# Patient Record
Sex: Male | Born: 1980 | Race: Black or African American | Hispanic: No | Marital: Married | Smoking: Current every day smoker
Health system: Southern US, Community
[De-identification: ages and names within clinical notes are randomized; demographics above are authoritative.]

## PROBLEM LIST (undated history)

## (undated) DIAGNOSIS — J45909 Unspecified asthma, uncomplicated: Secondary | ICD-10-CM

## (undated) HISTORY — PX: KNEE SURGERY: SHX244

---

## 2009-11-05 ENCOUNTER — Emergency Department: Payer: Self-pay | Admitting: Emergency Medicine

## 2010-11-10 ENCOUNTER — Observation Stay: Payer: Self-pay | Admitting: Internal Medicine

## 2011-08-19 ENCOUNTER — Emergency Department: Payer: Self-pay | Admitting: Emergency Medicine

## 2012-11-17 IMAGING — CR DG CHEST 1V PORT
1 series · 1 of 1 positions shown · non-contrast
Comparison: none

REASON FOR EXAM: cp, syncopy
COMMENTS:

PROCEDURE:     DXR - DXR PORTABLE CHEST SINGLE VIEW  - November 10, 2010 [DATE]
RESULT:     The lung fields are clear. Heart, mediastinal and osseous
structures show no significant abnormalities.

[view not recorded]
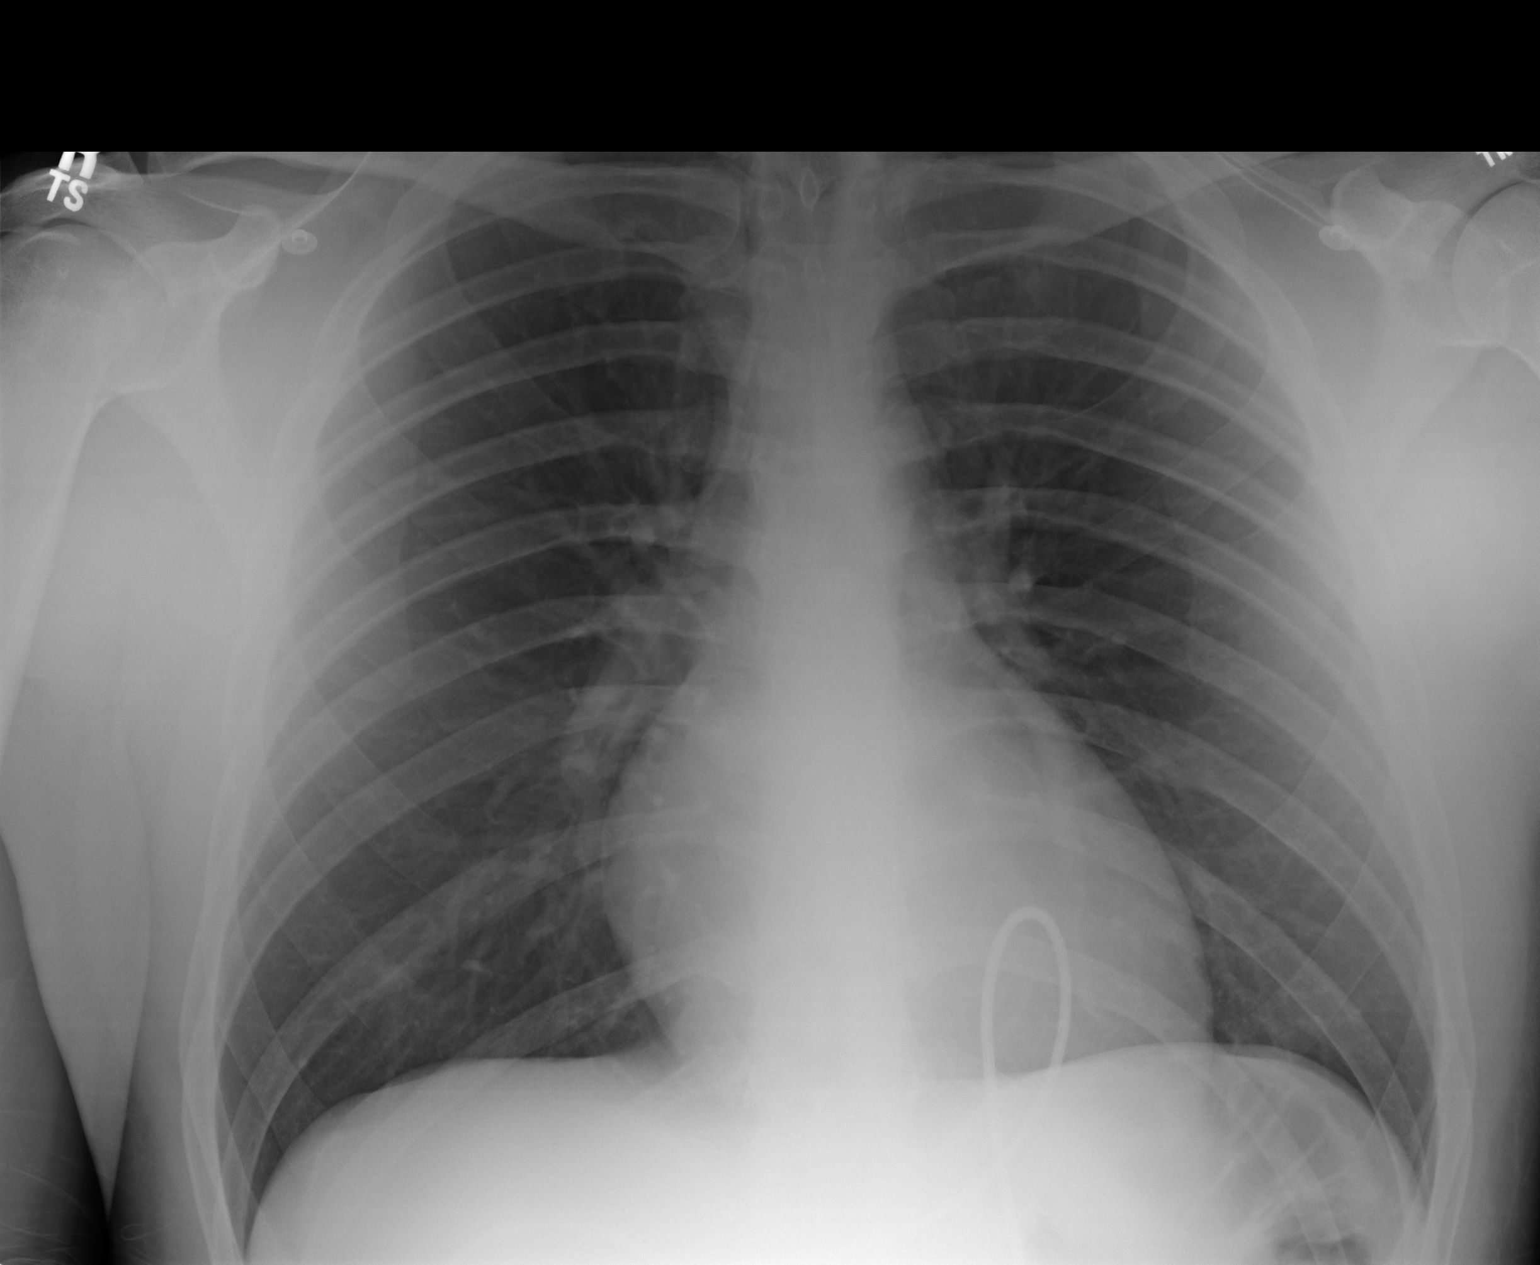

[1 of 1 positions shown; findings below may reference images not displayed]

IMPRESSION: 1. No acute changes are identified.

## 2012-11-17 IMAGING — CT CT HEAD WITHOUT CONTRAST
3 of 4 series · 17 of 30 positions shown, 19 images · non-contrast
Comparison: none

REASON FOR EXAM: syncope
COMMENTS:

[Series 2: bone · axial · 0.43mm/px · z∈[+900,+996]mm · 6 of 31 slices shown]
[im 4/31  bone]
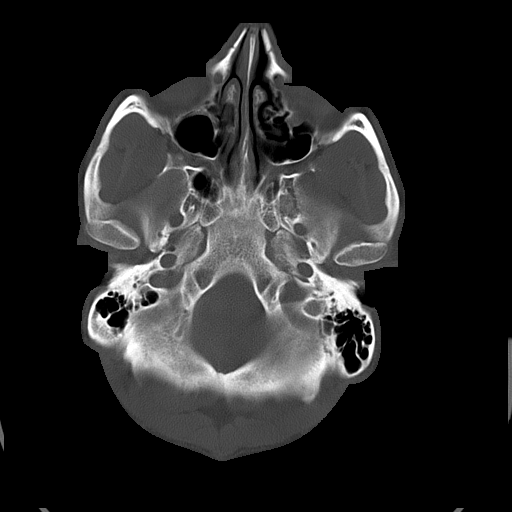
[im 8/31  bone]
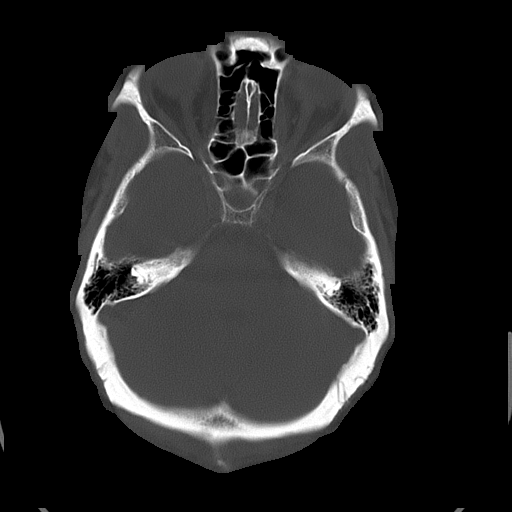
[im 12/31  bone]
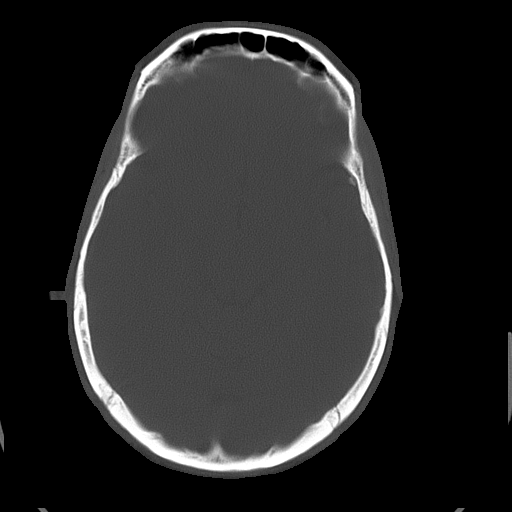
[im 16/31  bone]
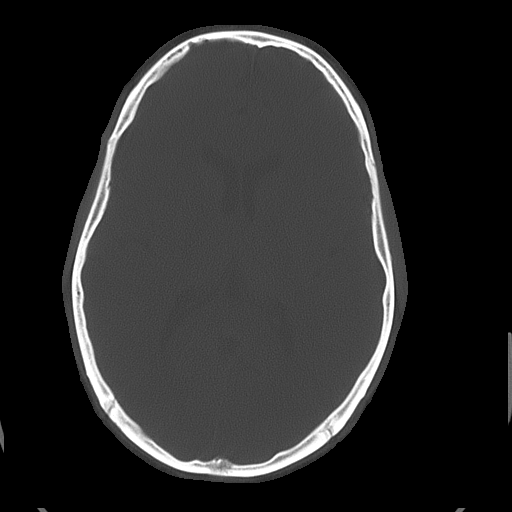
[im 19/31  bone]
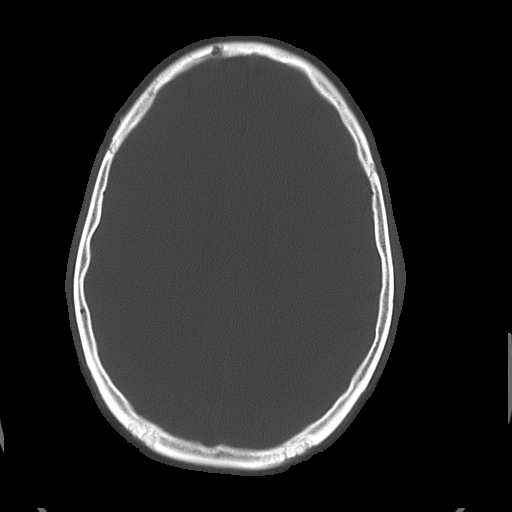
[im 23/31  bone]
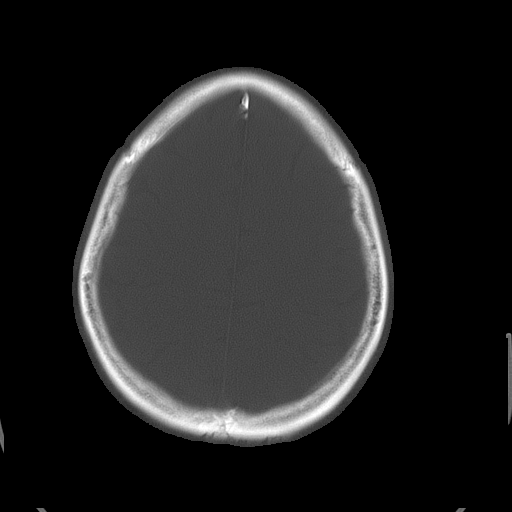

[Series 3: without · axial · non-contrast · 0.43mm/px · z∈[+900,+1016]mm · 7 of 31 slices shown, 9 images (1 of 2)]
[im 4/31  brain]
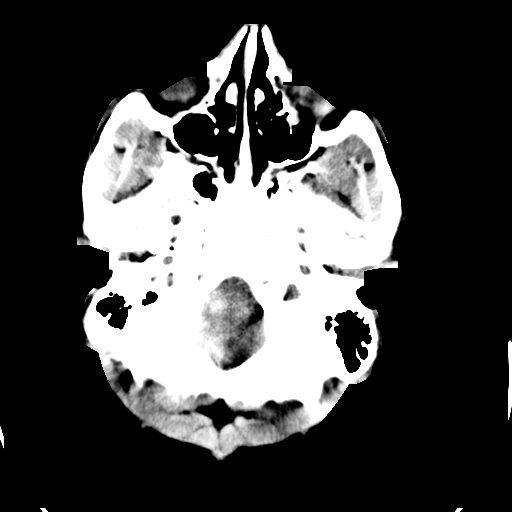
[im 4/31  bone]
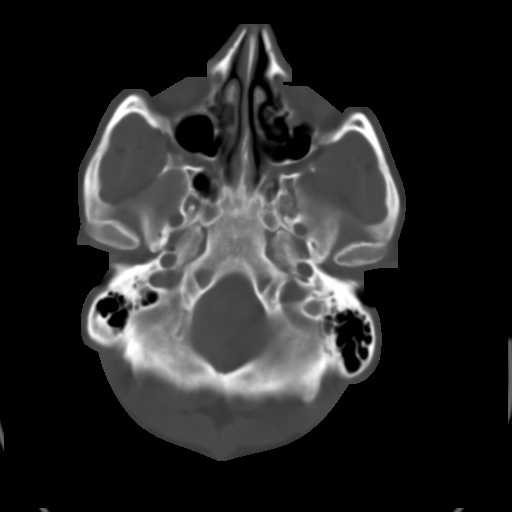
[im 8/31  brain]
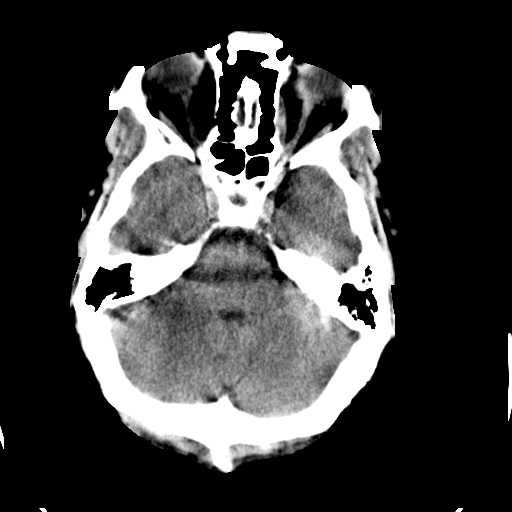
[im 12/31  brain]
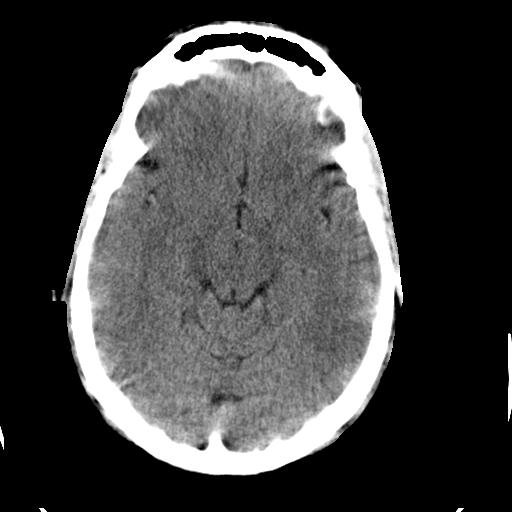
[im 16/31  brain]
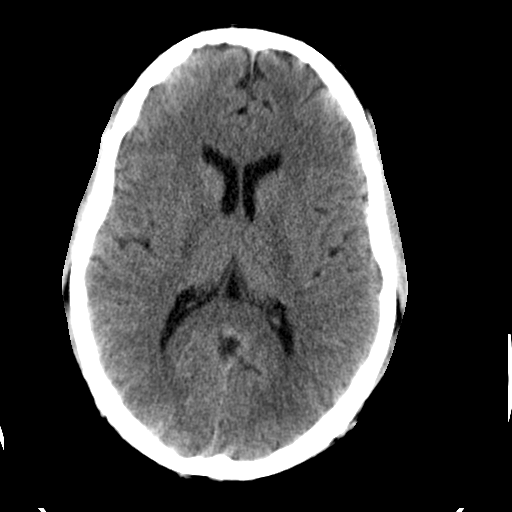
[im 19/31  brain]
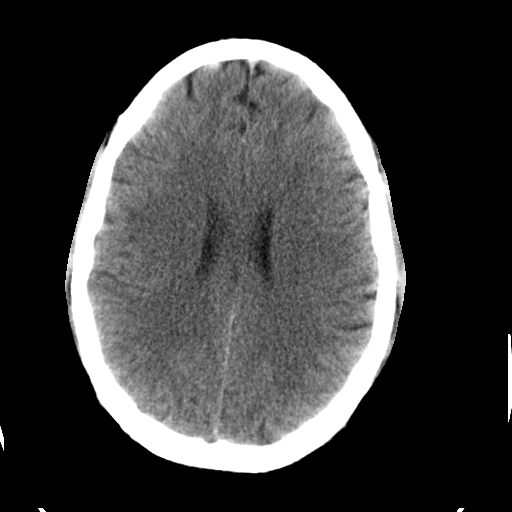
[im 19/31  bone]
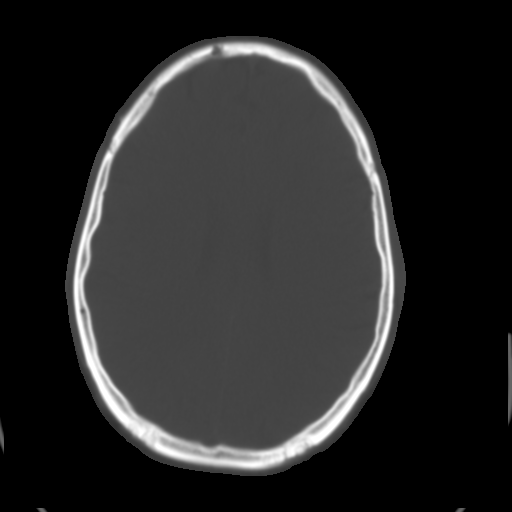
[im 23/31  brain]
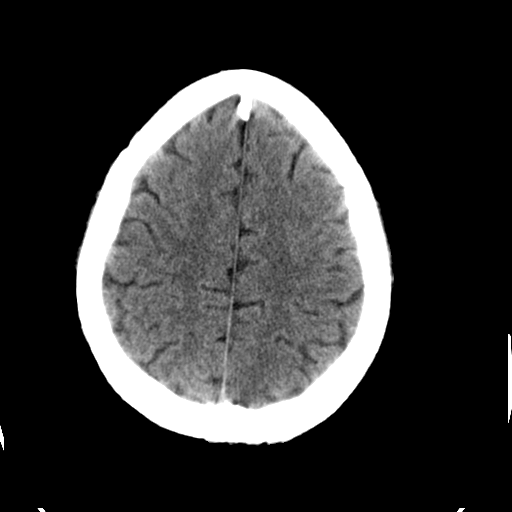
[im 27/31  brain]
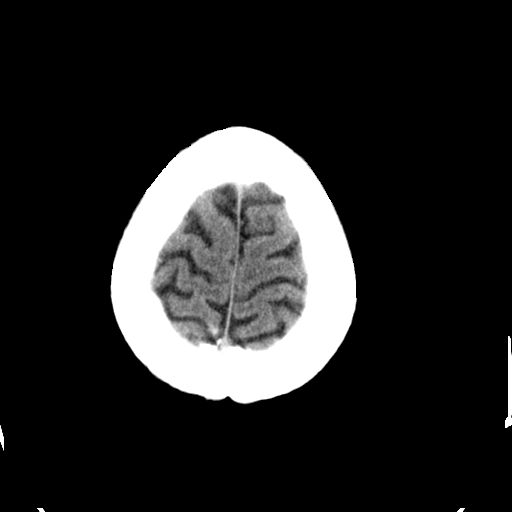

[Series 4: without · axial · non-contrast · 0.43mm/px · z∈[+906,+966]mm · 4 of 22 slices shown (2 of 2)]
[im 5/22  brain]
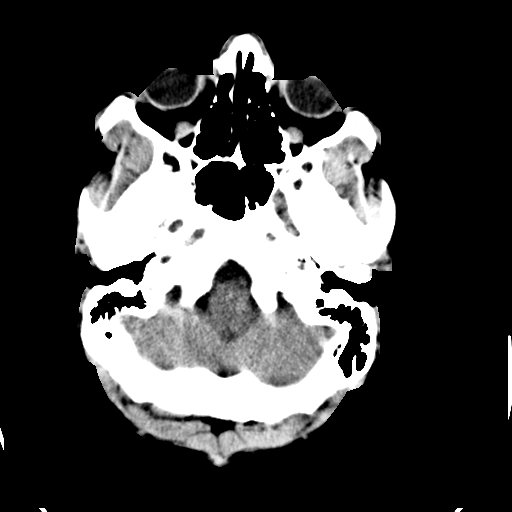
[im 9/22  brain]
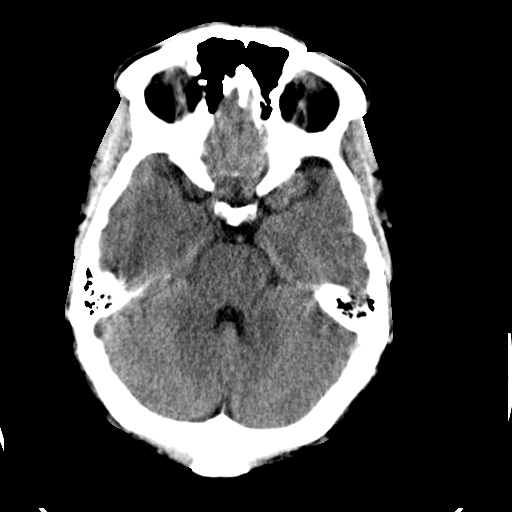
[im 13/22  brain]
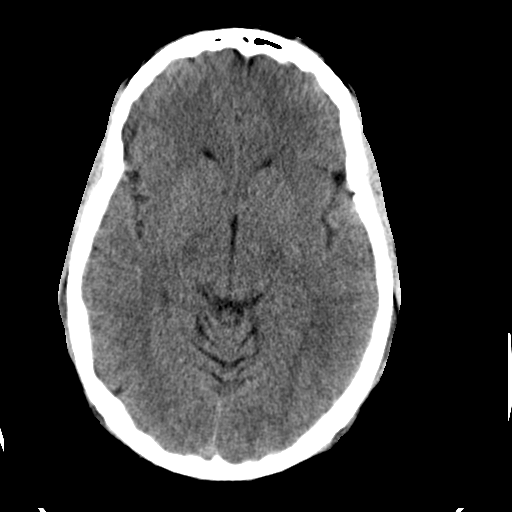
[im 17/22  brain]
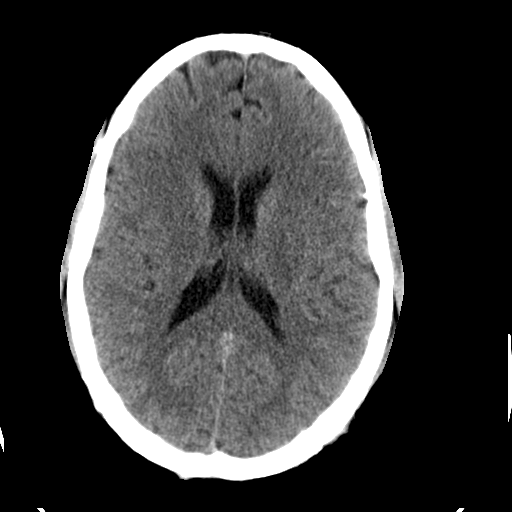

[17 of 30 positions shown; findings below may reference images not displayed]

PROCEDURE:     CT  - CT HEAD WITHOUT CONTRAST  - November 10, 2010  [DATE]

RESULT:     Axial noncontrast CT scanning was performed through the brain at
5 mm intervals and slice thicknesses. Images were repeated due to motion.

The ventricles are normal in size and position. There is no intracranial
hemorrhage nor intracranial mass effect. There is no evidence of an evolving
ischemic infarction. The cerebellum and brainstem are normal in density. At
bone window settings I see no air-fluid levels in the paranasal sinuses.
There is minimal mucoperiosteal thickening in the left maxillary sinus.
There is no evidence of an acute skull fracture. No lytic or blastic bony
lesion is seen.
IMPRESSION: I see no acute intracranial abnormality.

## 2013-09-17 ENCOUNTER — Encounter: Payer: Self-pay | Admitting: Surgery

## 2013-09-27 ENCOUNTER — Encounter: Payer: Self-pay | Admitting: Surgery

## 2013-10-28 ENCOUNTER — Encounter: Payer: Self-pay | Admitting: Surgery

## 2013-11-25 ENCOUNTER — Encounter: Payer: Self-pay | Admitting: Surgery

## 2015-09-07 ENCOUNTER — Ambulatory Visit (INDEPENDENT_AMBULATORY_CARE_PROVIDER_SITE_OTHER): Payer: Worker's Compensation

## 2015-09-07 ENCOUNTER — Encounter: Payer: Self-pay | Admitting: Gynecology

## 2015-09-07 ENCOUNTER — Ambulatory Visit
Admission: EM | Admit: 2015-09-07 | Discharge: 2015-09-07 | Disposition: A | Discharge status: Home / Self Care | Payer: Worker's Compensation | Attending: Family Medicine | Admitting: Family Medicine

## 2015-09-07 DIAGNOSIS — X31XXXA Exposure to excessive natural cold, initial encounter: Secondary | ICD-10-CM

## 2015-09-07 DIAGNOSIS — T33521A Superficial frostbite of right hand, initial encounter: Secondary | ICD-10-CM

## 2015-09-07 HISTORY — DX: Unspecified asthma, uncomplicated: J45.909

## 2015-09-07 MED ORDER — MUPIROCIN 2 % EX OINT: 1.0000 "application " | TOPICAL_OINTMENT | Freq: Three times a day (TID) | CUTANEOUS | Status: AC

## 2015-09-07 NOTE — Discharge Instructions (Signed)
Frostbite Frostbite happens when the skin and deeper tissues freeze. Frostbite can happen when the skin is not covered well in cold weather. Frostbite often affects the feet, hands, fingers, nose, and ears. You should get out of the cold right away if you have pain, redness, or loss of feeling (numbness) in part of your body. Frostbite can cause lasting damage.  HOME CARE  Follow instructions from your doctor about how to take care of bandages (dressings) that cover your blisters. It is important to:  Wash your hands with soap and water before you change your bandages. If you do not have soap and water, use hand sanitizer.  Change or remove your bandages as told by your doctor.  Rest your frozen (frostbitten) body parts. Raise (elevate) them above the level of your heart if possible.  Try to avoid moving your frozen body parts.  Take over-the-counter and prescription medicines only as told by your doctor.  Drink enough fluid to keep your pee (urine) clear or pale yellow.  Keep all follow-up visits as told by your doctor. This is important. GET HELP IF: You have a fever. GET HELP RIGHT AWAY IF:  You have more redness, swelling, or pain where your skin froze.  You have fluid, blood, or pus coming from where your skin froze.  You have very bad peeling skin where your skin froze.  Your frozen skin turns a dark color.  You see a red line on your skin and it spreads out from where your skin froze.   This information is not intended to replace advice given to you by your health care provider. Make sure you discuss any questions you have with your health care provider.   Document Released: 10/21/2004 Document Revised: 06/04/2015 Document Reviewed: 01/06/2015 Elsevier Interactive Patient Education [employer]2016 [employer]vier Inc.

## 2015-09-07 NOTE — ED Provider Notes (Signed)
CSN: [id number]     Arrival date & time 09/07/15  1024 History   First MD Initiated Contact with Patient 09/07/15 1205     Nurses notes were reviewed.   Chief Complaint  Patient presents with  . Finger Injury   He is here because of injuryto his fourth R  Finger. 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 Yesterday he noticed swelling of his forefinger and increased numbness.. This morning at his first break the swelling and numbness gotten worse and by lunchtime it progressively worsened. He denies any trauma at work that he does work in the freezer. No history of frostbite before or any trauma to the hand or finger. He states that about a month ago hefeeling of his thumb and the fourth finger and some increased numbness. 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  (Consider location/radiation/quality/duration/timing/severity/associated sxs/prior Treatment) Patient is a 34 y.o. male presenting with hand pain. The history is provided by the patient. No language interpreter was used.  Hand Pain This is a new problem. The current episode started yesterday. The problem occurs constantly. The problem has been gradually worsening. Pertinent negatives include no chest pain, no abdominal pain, no headaches and no shortness of breath. Nothing relieves the symptoms. He has tried nothing for the symptoms. The treatment provided no relief.    Past Medical History  Diagnosis Date  . Asthma    Past Surgical History  Procedure Laterality Date  . Knee surgery     No family history on file. Social History  Substance Use Topics  . Smoking status: Current Every Day Smoker -- 0.50 packs/day    Types: Cigarettes  . Smokeless tobacco: None  . Alcohol Use: Yes    Review of Systems  Respiratory: Negative for shortness of breath.     Cardiovascular: Negative for chest pain.  Gastrointestinal: Negative for abdominal pain.  Neurological: Negative for headaches.  All other systems reviewed and are negative.   Allergies  Review of patient's allergies indicates no known allergies.  Home Medications   Prior to Admission medications   Medication Sig Start Date End Date Taking? Authorizing Provider  ibuprofen (ADVIL,MOTRIN) 400 MG tablet Take 400 mg by mouth every 6 (six) hours as needed.   Yes Historical Provider, MD  mupirocin ointment (BACTROBAN) 2 % Apply 1 application topically 3 (three) times daily. 09/07/15   Hassan RowanEugene Labrea Eccleston, MD   Meds Ordered and Administered this Visit  Medications - No data to display  BP 119/75 mmHg  Pulse 73  Temp(Src) 97.5 F (36.4 C) (Oral)  Resp 18  Ht 6\' 2"  (1.88 m)  Wt 250 lb (113.399 kg)  BMI 32.08 kg/m2  SpO2 99% No data found.   Physical Exam  Constitutional: He is oriented to person, place, and time. He appears well-developed and well-nourished.  HENT:  Head: Normocephalic and atraumatic.  Eyes: Pupils are equal, round, and reactive to light.  Musculoskeletal: He exhibits edema. He exhibits no tenderness.       Right hand: He exhibits deformity and swelling. Normal sensation noted. Normal strength noted.       Hands:  numbness on the tip of the fourth finger. The fingers off she swollen no signs of broken skin or insect bites as noted. No drainage since coming from the finger but is  involving the finger to the nail bed and to the distal PIP joint  Neurological: He is alert and oriented to person, place, and time.  Skin: Skin is warm and dry.  Psychiatric: He has a normal mood and affect.  Vitals reviewed.    ED Course  Procedures (including critical care time)  Labs Review Labs Reviewed - No data to display  Imaging Review Dg Finger Ring Right  09/07/2015  CLINICAL DATA:  Right fourth finger numbness, no known injury, initial encounter EXAM: RIGHT RING FINGER 2+V  COMPARISON:  None. FINDINGS: There is no evidence of fracture or dislocation. There is no evidence of arthropathy or other focal bone abnormality. Soft tissues are unremarkable. IMPRESSION: No acute abnormality noted. Electronically Signed   By: Alcide Clever M.D.   On: 09/07/2015 13:18     Visual Acuity Review  Right Eye Distance:   Left Eye Distance:   Bilateral Distance:    Right Eye Near:   Left Eye Near:    Bilateral Near:         MDM   1. Frostbite of hand, right, initial encounter       We will x-ray the right hand the fourth ring finger make sure there is no foreign objects or sliver of metal  got into the finger.   X ray is negative for the finger. 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 We will limit his exposure to cold to no more than 54 at work. Will refer to grand Thelma Barge is Janace Litten for occupational medicine follow-up. Will try to have him start seeing between next Thursday and next Monday. We'll give him prescription for Bactrim ointment to apply to the finger 2-3 times a day until he sees nurse practitioner Christell Constant.        Hassan Rowan, MD 09/07/15 [phone]

## 2015-09-07 NOTE — ED Notes (Addendum)
[employer]. Injury. Per patient right ring finger swollen and bruised x 1 month. Per patient work in Designer, television/film setthe freezer section of store.

## 2021-10-13 ENCOUNTER — Emergency Department
Admission: EM | Admit: 2021-10-13 | Discharge: 2021-10-13 | Disposition: A | Discharge status: Home / Self Care | Payer: BC Managed Care – PPO | Attending: Emergency Medicine | Admitting: Emergency Medicine

## 2021-10-13 ENCOUNTER — Other Ambulatory Visit: Payer: Self-pay

## 2021-10-13 DIAGNOSIS — F32A Depression, unspecified: Secondary | ICD-10-CM

## 2021-10-13 DIAGNOSIS — R45851 Suicidal ideations: Secondary | ICD-10-CM | POA: Diagnosis not present

## 2021-10-13 DIAGNOSIS — F4325 Adjustment disorder with mixed disturbance of emotions and conduct: Secondary | ICD-10-CM

## 2021-10-13 DIAGNOSIS — F4320 Adjustment disorder, unspecified: Secondary | ICD-10-CM | POA: Insufficient documentation

## 2021-10-13 DIAGNOSIS — R451 Restlessness and agitation: Secondary | ICD-10-CM | POA: Insufficient documentation

## 2021-10-13 DIAGNOSIS — R4689 Other symptoms and signs involving appearance and behavior: Secondary | ICD-10-CM

## 2021-10-13 DIAGNOSIS — Z046 Encounter for general psychiatric examination, requested by authority: Secondary | ICD-10-CM | POA: Diagnosis not present

## 2021-10-13 DIAGNOSIS — R456 Violent behavior: Secondary | ICD-10-CM | POA: Diagnosis present

## 2021-10-13 NOTE — Discharge Instructions (Addendum)
Please visit [email or web address] to find a therapist

## 2021-10-13 NOTE — ED Notes (Signed)
Pt speaking with psych team. 

## 2021-10-13 NOTE — ED Notes (Signed)
Pt to room 21, but unwilling to dress into scrubs.  EDP made aware and at bedside.

## 2021-10-13 NOTE — Consult Note (Shared)
[facility] [place] Face-to-Face Psychiatry Consult   Reason for Consult:  Increasing agitation Referring Physician:  EDP Patient Identification: Stanley Morrow. MRN:  [id number] Principal Diagnosis: <principal problem not specified> Diagnosis:  Active Problems:   * No active hospital problems. *   Total Time spent with patient: 45 minutes  Subjective:   Stanley Morrow. is a 41 y.o. male patient admitted with psychological evaluation for increasing outbursts of agitation.  HPI:  Patient presents for increasing outbursts of agitation, seeking a "therapist or psychiatrist" per Dr. Vicente Males and wanting someone to "relate on my page." Visit today precipitated by recent outburst on Sunday 10/11/21 when he was out with his wife and kids. Reached out to employer and insurance today for guidance and was advised to come to ED. Pt notes several significant events in his life that are contributing to his current mental state, including parents' divorce in high school. DWI in 2020, and losing his job in 07/2021. Reports friends noticed a "change" about 90yr ago, and also began feeling depressed states since 04/2021 where he feels he "can't do nothing right." Has tried speaking with friends about his mood in the past and posting messages on social media but ridiculed for "attention seeking." Past history of suicidal ideation without a plan, owns guns and brings them to his neighbor's safe when he begins to have these feelings. Denies homicidal ideation or intention for self-harm. Patient no longer drinks and uses marijuana 1x/wk when "my mind starts to run." No other illicit drug use.  Past Psychiatric History: Suicidal ideation starting at age 16. No other h/o psychiatric care.  Risk to Self:   Risk to Others:   Prior Inpatient Therapy:   Prior Outpatient Therapy:    Past Medical History:  Past Medical History:  Diagnosis Date   Asthma     Past Surgical History:  Procedure Laterality Date   KNEE SURGERY      Family History: No family history on file. Family Psychiatric  History: None reported. Social History:  Social History   Substance and Sexual Activity  Alcohol Use Yes     Social History   Substance and Sexual Activity  Drug Use Not on file    Social History   Socioeconomic History   Marital status: Married    Spouse name: Not on file   Number of children: Not on file   Years of education: Not on file   Highest education level: Not on file  Occupational History   Not on file  Tobacco Use   Smoking status: Every Day    Packs/day: 0.50    Types: Cigarettes   Smokeless tobacco: Not on file  Substance and Sexual Activity   Alcohol use: Yes   Drug use: Not on file   Sexual activity: Not on file  Other Topics Concern   Not on file  Social History Narrative   Not on file   Social Determinants of Health   Financial Resource Strain: Not on file  Food Insecurity: Not on file  Transportation Needs: Not on file  Physical Activity: Not on file  Stress: Not on file  Social Connections: Not on file   Additional Social History:    Allergies:  No Known Allergies  Labs: No results found for this or any previous visit (from the past 48 hour(s)).  No current facility-administered medications for this encounter.   Current Outpatient Medications  Medication Sig Dispense Refill   ibuprofen (ADVIL,MOTRIN) 400 MG tablet Take 400 mg by mouth  every 6 (six) hours as needed.     mupirocin ointment (BACTROBAN) 2 % Apply 1 application topically 3 (three) times daily. (Patient not taking: Reported on 10/13/2021) 22 g 0    Musculoskeletal: Strength & Muscle Tone: within normal limits Gait & Station: normal Patient leans: N/A   Psychiatric Specialty Exam:  Presentation  General Appearance: No data recorded Eye Contact:No data recorded Speech:No data recorded Speech Volume:No data recorded Handedness:No data recorded  Mood and Affect  Mood:No data recorded Affect:No data  recorded  Thought Process  Thought Processes:No data recorded Descriptions of Associations:No data recorded Orientation:No data recorded Thought Content:No data recorded History of Schizophrenia/Schizoaffective disorder:No data recorded Duration of Psychotic Symptoms:No data recorded Hallucinations:No data recorded Ideas of Reference:No data recorded Suicidal Thoughts:No data recorded Homicidal Thoughts:No data recorded  Sensorium  Memory:No data recorded Judgment:No data recorded Insight:No data recorded  Executive Functions  Concentration:No data recorded Attention Span:No data recorded Recall:No data recorded Fund of Knowledge:No data recorded Language:No data recorded  Psychomotor Activity  Psychomotor Activity:No data recorded  Assets  Assets:No data recorded  Sleep  Sleep:No data recorded  Physical Exam: Physical Exam Vitals and nursing note reviewed.  HENT:     Head: Normocephalic.  Musculoskeletal:        General: Normal range of motion.  Neurological:     Mental Status: He is alert and oriented to person, place, and time.  Psychiatric:        Attention and Perception: Attention normal.        Mood and Affect: Mood and affect normal.        Speech: Speech normal.        Behavior: Behavior is cooperative.        Thought Content: Thought content normal.        Cognition and Memory: Cognition normal.        Judgment: Judgment normal.   Review of Systems  Psychiatric/Behavioral:  Negative for depression, substance abuse and suicidal ideas.   All other systems reviewed and are negative. Blood pressure 136/81, pulse 96, temperature 98.7 F (37.1 C), temperature source Oral, resp. rate 18, height 6\' 2"  (1.88 m), weight 102.1 kg, SpO2 99 %. Body mass index is 28.89 kg/m.  Treatment Plan Summary: Praised patient for forthcoming behavior to seek treatment for mental health. Advised patient to visit [email or web address] to find therapist for outpatient  care.  Disposition: No evidence of imminent risk to self or others at present.   Discussed crisis plan, support from social network, calling 911, coming to the Emergency Department, and calling Suicide Hotline.  , Student-PA 10/13/2021 2:44 PM

## 2021-10-13 NOTE — ED Notes (Signed)
Pt discharged home.  VS stable. Pt denies SI.   °

## 2021-10-13 NOTE — ED Triage Notes (Signed)
Pt here with for a psychiatric evaluation. Pt state he has been dealing with some mental health issues over the years and wanted to get some help. Pt is voluntary and cooperative.

## 2021-10-13 NOTE — ED Provider Notes (Addendum)
[facility] Provider Note    Event Date/Time   First MD Initiated Contact with Patient 10/13/21 1303     (approximate)   History   Psychiatric Evaluation   HPI  Stanley Morrow. is a 41 y.o. male with no stated past medical history who presents for psychological evaluation due to increasing outbursts of agitation.  Patient states that he has also had intermittent suicidal ideation with out a plan however patient states that "I take all my stuff to my friend's house and leave it there" so that he cannot use it to take his own life.  Patient states that he has always had a problem with his anger but recently it has been affecting his life and kids so much that he wanted to talk to someone like a "therapist or psychiatrist" as he does not feel that he can talk to his friends.  Patient currently denies any SI/HI/AVH     Physical Exam   Triage Vital Signs: ED Triage Vitals  Enc Vitals Group     BP 10/13/21 1256 136/81     Pulse Rate 10/13/21 1256 96     Resp 10/13/21 1256 18     Temp 10/13/21 1256 98.7 F (37.1 C)     Temp Source 10/13/21 1256 Oral     SpO2 10/13/21 1256 99 %     Weight 10/13/21 1255 225 lb (102.1 kg)     Height 10/13/21 1255 6\' 2"  (1.88 m)     Head Circumference --      Peak Flow --      Pain Score 10/13/21 1255 0     Pain Loc --      Pain Edu? --      Excl. in [place]? --     Most recent vital signs: Vitals:   10/13/21 1256  BP: 136/81  Pulse: 96  Resp: 18  Temp: 98.7 F (37.1 C)  SpO2: 99%    General: Awake, no distress.  CV:  Good peripheral perfusion.  Resp:  Normal effort.  Abd:  No distention.  Other:  Middle-aged African-American male sitting on stretcher in no distress with good insight and cooperative   ED Results / Procedures / Treatments   Labs (all labs ordered are listed, but only abnormal results are displayed) Labs Reviewed - No data to display PROCEDURES:  Critical Care performed:  No  Procedures   MEDICATIONS ORDERED IN ED: Medications - No data to display   IMPRESSION / MDM / [place] / ED COURSE  I reviewed the triage vital signs and the nursing notes.                              Differential diagnosis includes, but is not limited to, psychosis, schizophrenia, bipolar disorder, alcohol abuse, illicit drug abuse  Patient complains of increasing agitation with violent outbursts. Given History and Exam I have low suspicion for toxic ingestion, anemia, hypothyroidism, infection, or ICH as a cause for this presentation. Interventions: Lorazepam 2mg  IM + Haldol 5mg  IM as needed  Reassess after consult: Patient shows no aggression toward staff and psychiatry feels he is stable for discharge at this time  Disposition (Discharge): Patient has been evaluated by her psychiatric team who does not feel he meets criteria for inpatient admission at this time and therefore stable for discharge       FINAL CLINICAL IMPRESSION(S) / ED DIAGNOSES   Final  diagnoses:  Aggressive behavior     Rx / DC Orders   ED Discharge Orders     None        Note:  This document was prepared using Dragon voice recognition software and may include unintentional dictation errors.   Naaman Plummer, MD 10/13/21 1344    Naaman Plummer, MD 10/13/21 [phone]

## 2021-10-13 NOTE — Consult Note (Signed)
[facility] Consult   Reason for Consult:  outbursts of agitation Referring Physician:  Cheri Fowler Patient Identification: Stanley Morrow. MRN:  [id number] Principal Diagnosis: Adjustment reaction Diagnosis:  Principal Problem:   Adjustment reaction   Total Time spent with patient: 1 hour  Subjective:  "I want to talk to somebody who relates on my page." Stanley Morrow. is a 41 y.o. male patient admitted with psychiatric evaluation.  HPI:  Patient seen face-to-face and chart reviewed. Patient is interactive, with adequate energy and bright affect. He expresses himself in thoughtful, linear, sentences. He states that he has been experiencing periods of "episodes" for about a year, where he will get angry and lash out verbally at his wife and kids. He points to episodes where the kids will not want to hang out with him, even though "I give them everything they want."  When they say that to him he states he will reply with things like "I don't want to be with you either." This makes him feel bad about his parenting. Patient describes a situation 2 days ago in which he got angry beyond what he feels he should have and threw dog food all over. Patient reached out to employer today and contacted their health insurance company who advised he go to ED for evaluation. 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  He says he has "stuffed things" his whole life and he needs to talk to someone.  Patient states his father was an alcohol and parents divorced when he was a young teenager. He felt he took on a lot of stress for the family at that time. Patient admits to smoking marijuana 2-4 x monthly. Denies other illicit drug use. Patient says he received a DUI in 2020 and has not had alcohol since. He describes poor sleep, saying "he doesn't need a lot of sleep." Does not  describe true manic episodes. Denies ever being in a state of depression. "Mostly anger" he says, but some loss of interest in going to gym. Listens to music to calm himself. Posts positive affirmations on social media.  Patient denies suicidal ideations; he states he just wants to connect with a therapist. Patient admitted taking weapons to his friend's house and states he will leave them there. He states he has never had a suicide attempt, has no thought or intention; "I have a lot to live for. I am going to get some therapy. I can do this."  Patient states he has no interest in medication for mood regulation. He is motivated for outpatient mental health treatment with therapy. Patient is connected with insurance company for referrals; Probation officer also informed patient of [email or web address]    Patient does not meet criteria for inpatient psychiatric hospitalization and does not desire it. Patient attempted to reach patient's wife for collateral, with  his permission. She did not return my call.   Past Psychiatric History:   Risk to Self:   Risk to Others:   Prior Inpatient Therapy:   Prior Outpatient Therapy:    Past Medical History:  Past Medical History:  Diagnosis Date   Asthma     Past Surgical History:  Procedure Laterality Date   KNEE SURGERY     Family History: No family history on file. Family Psychiatric  History: father, alcoholism Social History:  Social History   Substance and Sexual Activity  Alcohol Use Not Currently     Social History   Substance and Sexual Activity  Drug Use Never    Social History   Socioeconomic History   Marital status: Married    Spouse name: Not on file   Number of children: Not on file   Years of education: Not on file   Highest education level: Not on file  Occupational History   Not on file  Tobacco Use   Smoking status: Every Day    Packs/day: 0.50    Types: Cigarettes   Smokeless tobacco: Not on file  Substance and Sexual  Activity   Alcohol use: Not Currently   Drug use: Never   Sexual activity: Not on file  Other Topics Concern   Not on file  Social History Narrative   Not on file   Social Determinants of Health   Financial Resource Strain: Not on file  Food Insecurity: Not on file  Transportation Needs: Not on file  Physical Activity: Not on file  Stress: Not on file  Social Connections: Not on file   Additional Social History:    Allergies:  No Known Allergies  Labs: No results found for this or any previous visit (from the past 48 hour(s)).  No current facility-administered medications for this encounter.   Current Outpatient Medications  Medication Sig Dispense Refill   ibuprofen (ADVIL,MOTRIN) 400 MG tablet Take 400 mg by mouth every 6 (six) hours as needed.     mupirocin ointment (BACTROBAN) 2 % Apply 1 application topically 3 (three) times daily. (Patient not taking: Reported on 10/13/2021) 22 g 0    Musculoskeletal: Strength & Muscle Tone: within normal limits Gait & Station: normal Patient leans: N/A   Psychiatric Specialty Exam:  Presentation  General Appearance: Appropriate for Environment Eye Contact:Good Speech:Clear and Coherent Speech Volume:Normal Handedness:No data recorded  Mood and Affect  Mood:Euthymic Affect:Congruent  Thought Process  Thought Processes:Coherent Descriptions of Associations:Intact  Orientation:Full (Time, Place and Person)  Thought Content:Logical  History of Schizophrenia/Schizoaffective disorder:No data recorded Duration of Psychotic Symptoms:No data recorded Hallucinations:Hallucinations: None  Ideas of Reference:None  Suicidal Thoughts:Suicidal Thoughts: No  Homicidal Thoughts:Homicidal Thoughts: No   Sensorium  Memory:Immediate Good; Recent Good Judgment:Intact Insight:Good  Executive Functions  Concentration:Good Attention Span:Good [place] of Knowledge:Good Language:Good  Psychomotor Activity   Psychomotor Activity:Psychomotor Activity: Normal  Assets  Assets:Housing; Catering manager; Desire for Improvement; Communication Skills; Physical Health; Resilience; Social Support; Vocational/Educational  Sleep  Sleep:Sleep: Fair  Physical Exam: Physical Exam Vitals and nursing note reviewed.  HENT:     Head: Normocephalic.     Nose: No congestion or rhinorrhea.  Eyes:     General:        Right eye: No discharge.        Left eye: No discharge.  Cardiovascular:     Rate and Rhythm: Normal rate.  Pulmonary:     Effort: Pulmonary effort is normal.  Musculoskeletal:        General: Normal range of motion.     Cervical back: Normal range of motion.  Neurological:     Mental Status: He is alert and oriented to person, place, and time.   Review of Systems  All other systems reviewed and are negative. Blood pressure 136/81, pulse 96, temperature 98.7 F (37.1 C), temperature source Oral, resp. rate 18, height 6\' 2"  (1.88 m), weight 102.1 kg, SpO2 99 %. Body mass index is 28.89 kg/m.  Treatment Plan Summary: Plan Patient given resources for outpatient  mental health treatment from Probation officer. Patient also showed Probation officer an Application he has obtained on his phone for therapy services. Reviewed with EDP  Disposition: No evidence of imminent risk to self or others at present.   Patient does not meet criteria for psychiatric inpatient admission. Discussed crisis plan, support from social network, calling 911, coming to the Emergency Department, and calling Suicide Hotline.  Sherlon Handing, NP 10/13/2021 6:48 PM
